# Patient Record
Sex: Male | Born: 2000 | Race: White | Hispanic: No | Marital: Single | Smoking: Never smoker
Health system: Southern US, Community
[De-identification: ages and names within clinical notes are randomized; demographics above are authoritative.]

---

## 2021-11-30 ENCOUNTER — Emergency Department: Payer: 59

## 2021-11-30 ENCOUNTER — Other Ambulatory Visit: Payer: Self-pay

## 2021-11-30 ENCOUNTER — Emergency Department
Admission: EM | Admit: 2021-11-30 | Discharge: 2021-11-30 | Disposition: A | Discharge status: Home / Self Care | Payer: 59 | Attending: Emergency Medicine | Admitting: Emergency Medicine

## 2021-11-30 ENCOUNTER — Encounter: Payer: Self-pay | Admitting: Emergency Medicine

## 2021-11-30 DIAGNOSIS — S6991XA Unspecified injury of right wrist, hand and finger(s), initial encounter: Secondary | ICD-10-CM | POA: Diagnosis present

## 2021-11-30 DIAGNOSIS — R030 Elevated blood-pressure reading, without diagnosis of hypertension: Secondary | ICD-10-CM

## 2021-11-30 DIAGNOSIS — S60212A Contusion of left wrist, initial encounter: Secondary | ICD-10-CM | POA: Insufficient documentation

## 2021-11-30 DIAGNOSIS — W228XXA Striking against or struck by other objects, initial encounter: Secondary | ICD-10-CM | POA: Insufficient documentation

## 2021-11-30 MED ORDER — IBUPROFEN 600 MG PO TABS: 600.0000 mg | ORAL_TABLET | Freq: Four times a day (QID) | ORAL | 0 refills | Status: AC | PRN

## 2021-11-30 NOTE — Discharge Instructions (Addendum)
Follow-up with Dr. Okey Dupre if you continue to have problems with your hand or wrist.  X-rays today did not show a fracture.  Ice and elevate to reduce swelling and help with pain.  A prescription is attached to your discharge papers for ibuprofen to take with food as needed for pain and inflammation.  Limited use of your left hand for 2 to 3 days.  You should follow-up and have your blood pressure rechecked as your blood pressure was elevated at 139/99 which may be due to pain.

## 2021-11-30 NOTE — ED Triage Notes (Signed)
Pt via POV from home. Pt punched padded wall. Pt c/o  L wrist pain. Pt is A&OX4 and NAD.

## 2021-11-30 NOTE — ED Provider Notes (Signed)
[facility] Provider Note    Event Date/Time   First MD Initiated Contact with Patient 11/30/21 1317     (approximate)   History   Wrist Pain   HPI  Brian Hubbard is a 21 y.o. male presents to to the ED with complaint of left wrist and distal radius and ulna pain.  Patient states that he punched a padded wall and has had pain in that area since.  Patient states that this happened yesterday.  He denies any health problems and currently is not taking any medications.  No over-the-counter medications have been taken for this injury.  Currently rates his pain as an 8 out of 10.      Physical Exam   Triage Vital Signs: ED Triage Vitals  Enc Vitals Group     BP 11/30/21 1228 (!) 139/99     Pulse Rate 11/30/21 1228 72     Resp 11/30/21 1228 18     Temp 11/30/21 1228 99 F (37.2 C)     Temp Source 11/30/21 1228 Oral     SpO2 11/30/21 1228 97 %     Weight 11/30/21 1228 165 lb (74.8 kg)     Height 11/30/21 1228 5\' 11"  (1.803 m)     Head Circumference --      Peak Flow --      Pain Score 11/30/21 1227 8     Pain Loc --      Pain Edu? --      Excl. in GC? --     Most recent vital signs: Vitals:   11/30/21 1228 11/30/21 1351  BP: (!) 139/99 (!) 136/94  Pulse: 72 68  Resp: 18 20  Temp: 99 F (37.2 C)   SpO2: 97% 100%     General: Awake, no distress.  CV:  Good peripheral perfusion.  Resp:  Normal effort.  Abd:  No distention.  Other:  On examination of left wrist there is no gross deformity and no soft tissue edema present.  There is some tenderness on palpation of the distal radius and ulna.  No skin discoloration and skin intact.  Motor sensory function intact.  Radial pulses present.  Patient is able to move digits distally without any difficulty.   ED Results / Procedures / Treatments   Labs (all labs ordered are listed, but only abnormal results are displayed) Labs Reviewed - No data to display    RADIOLOGY X-ray views of the  left wrist were reviewed by myself and no acute bony injury was noted.  Radiology report confirms no fracture or dislocation.    PROCEDURES:  Critical Care performed:   Procedures   MEDICATIONS ORDERED IN ED: Medications - No data to display   IMPRESSION / MDM / ASSESSMENT AND PLAN / ED COURSE  I reviewed the triage vital signs and the nursing notes.   Differential diagnosis includes, but is not limited to, fracture left wrist, contusion left wrist, sprain, fracture distal ulna radius.  21 year old male presents to the ED with complaint of left wrist pain after he punched a padded wall yesterday.  Patient states he has not taken any over-the-counter medication for this injury.  Physical exam is low suspicion for fracture as there is no swelling or discoloration.  Radiology images were reviewed and no fracture or dislocation was noted and radiology report confirms this.  I discussed findings with patient and a prescription for ibuprofen 600 mg was written for him to take as needed.  He also was strongly encouraged to ice and elevate his wrist to reduce swelling and help control pain.  An Ace wrap was applied by myself for added support and protection.  Blood pressure was elevated while in the ED at 139/99.  This could be due to pain and should be followed up.  Repeat blood pressure prior to discharge and is still elevated should have his blood pressure rechecked at the [facility].    FINAL CLINICAL IMPRESSION(S) / ED DIAGNOSES   Final diagnoses:  Contusion of left wrist, initial encounter  Elevated blood pressure reading     Rx / DC Orders   ED Discharge Orders          Ordered    ibuprofen (ADVIL) 600 MG tablet  Every 6 hours PRN        11/30/21 1338             Note:  This document was prepared using Dragon voice recognition software and may include unintentional dictation errors.   Tommi Rumps, PA-C 11/30/21 1432    Arnaldo Natal, MD 11/30/21  [phone]

## 2023-08-25 IMAGING — DX DG WRIST COMPLETE 3+V*L*
4 series · 4 of 4 positions shown · non-contrast
Comparison: None.

CLINICAL DATA: Wrist injury. Patient reports punching a wall
yesterday.

EXAM:
LEFT WRIST - COMPLETE 3+ VIEW

[wrist ap (1 of 2)]
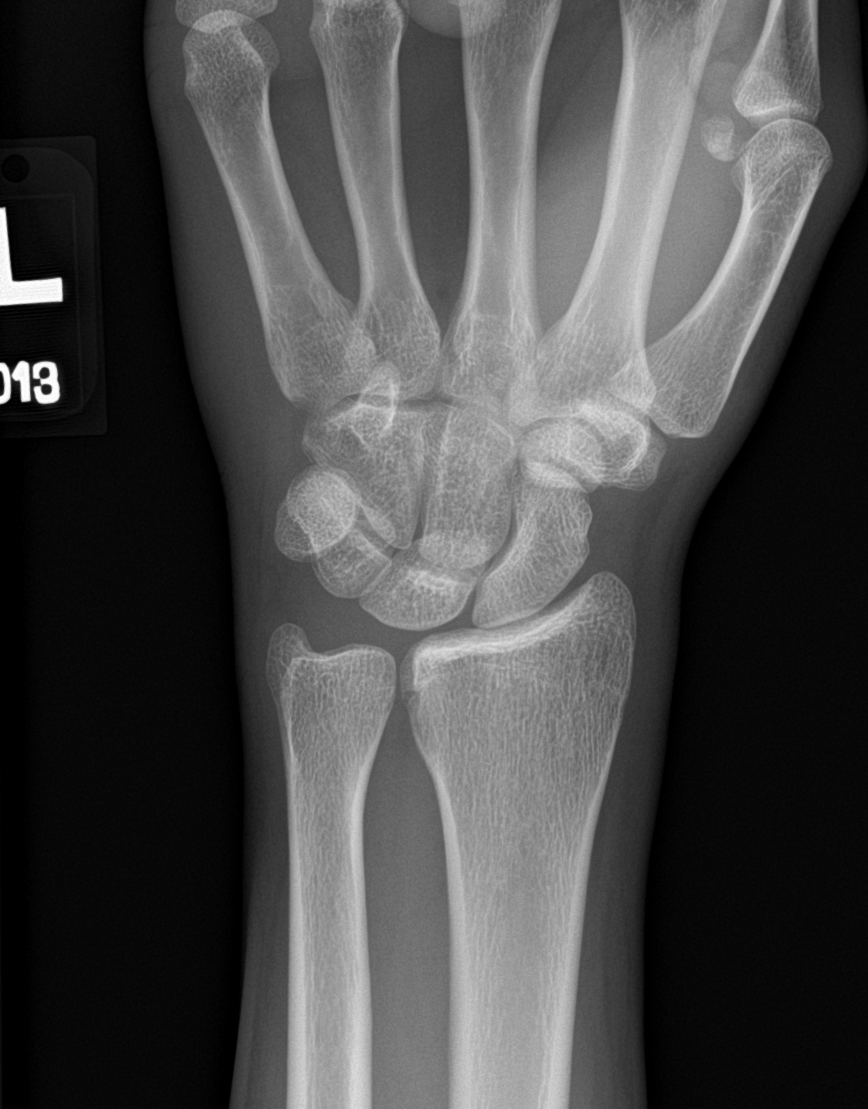

[wrist obl]
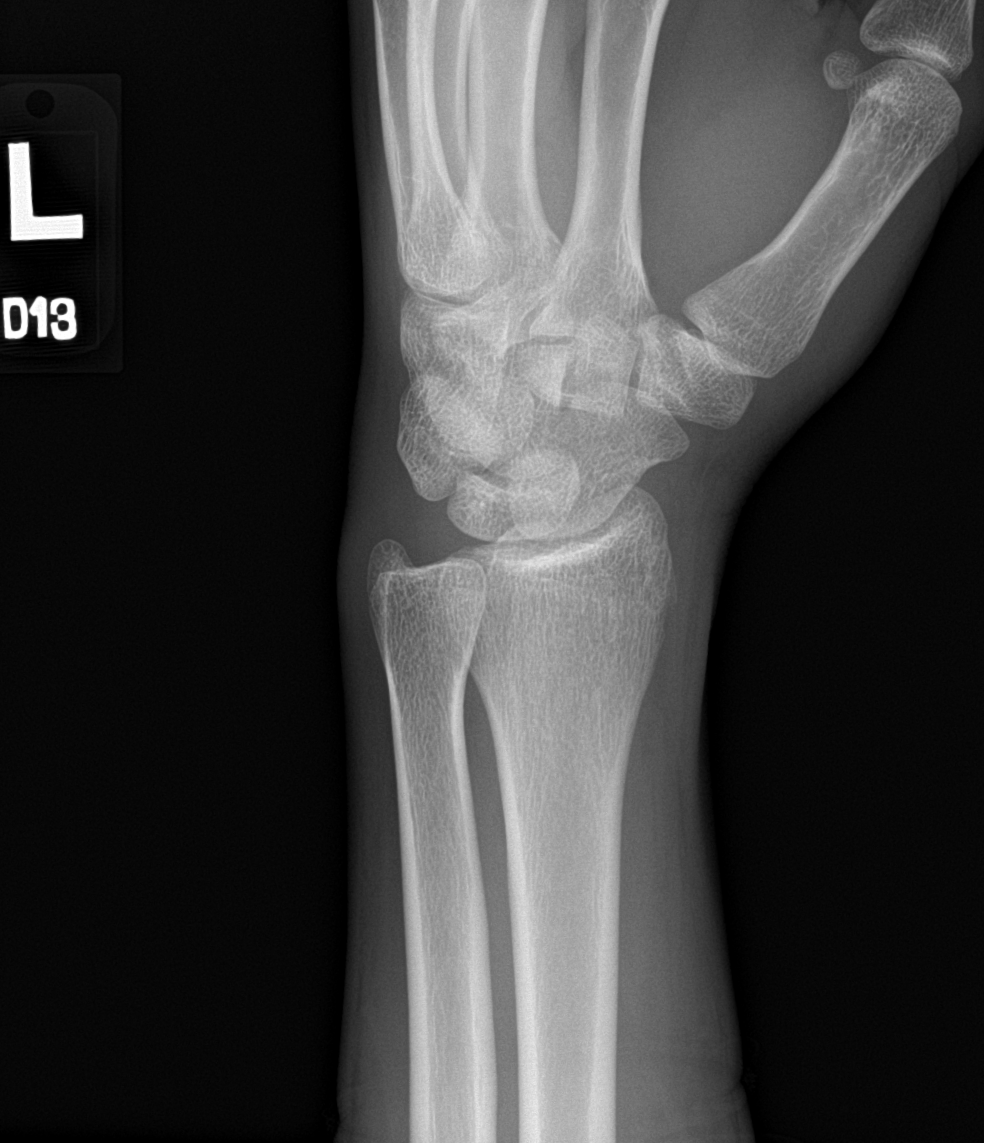

[wrist lat]
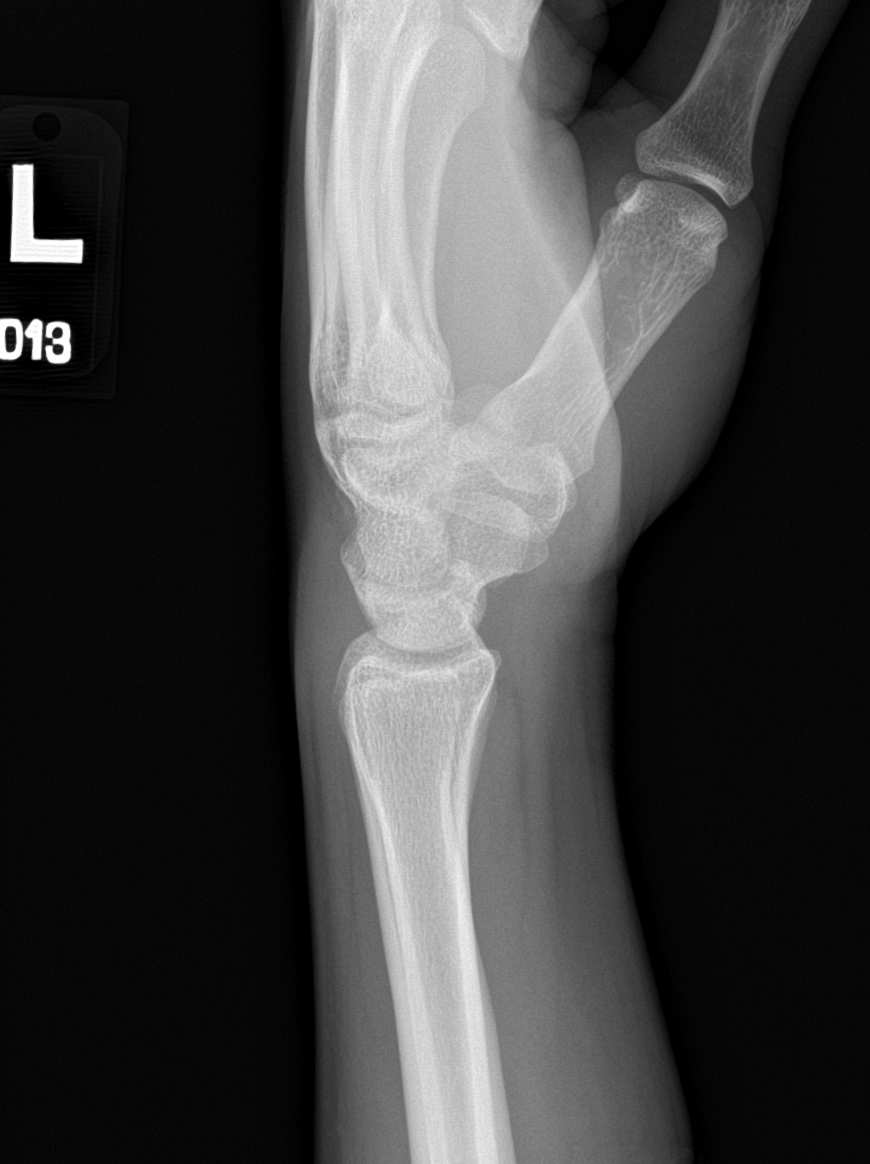

[wrist ap (2 of 2)]
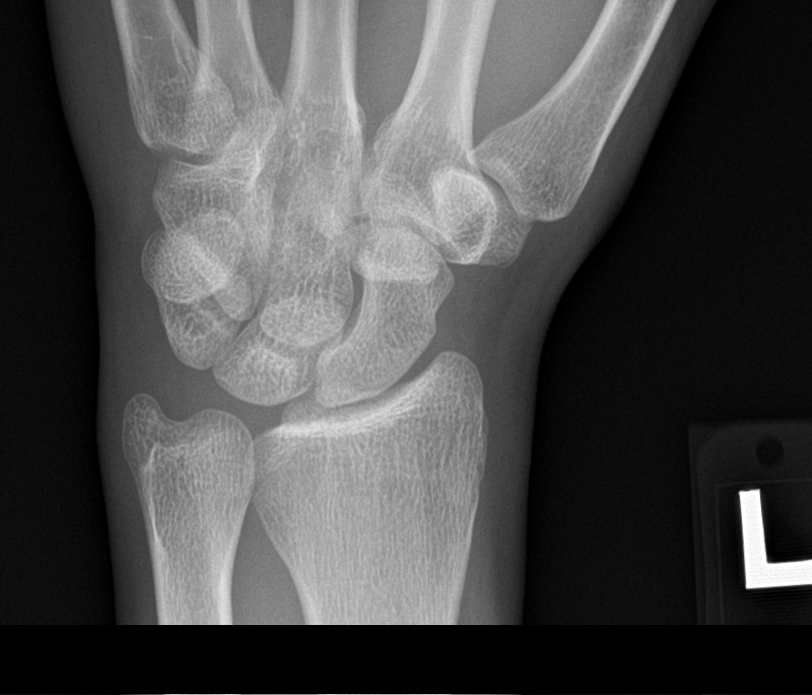

[4 of 4 positions shown; findings below may reference images not displayed]

FINDINGS: The mineralization and alignment are normal. There is no evidence of
acute fracture or dislocation. The joint spaces are preserved. No
foreign body or soft tissue emphysema identified.
IMPRESSION: No evidence of acute left wrist fracture or dislocation.
